# Patient Record
Sex: Female | Born: 2005 | Race: White | Hispanic: No | Marital: Single | Smoking: Never smoker
Health system: Southern US, Community
[De-identification: ages and names within clinical notes are randomized; demographics above are authoritative.]

## PROBLEM LIST (undated history)

## (undated) DIAGNOSIS — F419 Anxiety disorder, unspecified: Secondary | ICD-10-CM

## (undated) DIAGNOSIS — F22 Delusional disorders: Secondary | ICD-10-CM

## (undated) DIAGNOSIS — E282 Polycystic ovarian syndrome: Secondary | ICD-10-CM

## (undated) DIAGNOSIS — K029 Dental caries, unspecified: Secondary | ICD-10-CM

## (undated) DIAGNOSIS — Z8719 Personal history of other diseases of the digestive system: Secondary | ICD-10-CM

## (undated) DIAGNOSIS — F32A Depression, unspecified: Secondary | ICD-10-CM

## (undated) DIAGNOSIS — K047 Periapical abscess without sinus: Secondary | ICD-10-CM

## (undated) DIAGNOSIS — F329 Major depressive disorder, single episode, unspecified: Secondary | ICD-10-CM

## (undated) HISTORY — DX: Polycystic ovarian syndrome: E28.2

## (undated) HISTORY — PX: TOOTH EXTRACTION: SUR596

---
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
## 2023-01-03 DIAGNOSIS — F250 Schizoaffective disorder, bipolar type: Secondary | ICD-10-CM | POA: Diagnosis not present

## 2023-01-03 DIAGNOSIS — F840 Autistic disorder: Secondary | ICD-10-CM | POA: Diagnosis not present

## 2023-01-04 DIAGNOSIS — F250 Schizoaffective disorder, bipolar type: Secondary | ICD-10-CM | POA: Diagnosis not present

## 2023-01-05 ENCOUNTER — Other Ambulatory Visit (INDEPENDENT_AMBULATORY_CARE_PROVIDER_SITE_OTHER): Payer: Self-pay | Admitting: Family

## 2023-01-16 DIAGNOSIS — H60592 Other noninfective acute otitis externa, left ear: Secondary | ICD-10-CM | POA: Diagnosis not present

## 2023-01-16 DIAGNOSIS — H6122 Impacted cerumen, left ear: Secondary | ICD-10-CM | POA: Diagnosis not present

## 2023-01-16 DIAGNOSIS — R140 Abdominal distension (gaseous): Secondary | ICD-10-CM | POA: Diagnosis not present

## 2023-01-16 DIAGNOSIS — N9489 Other specified conditions associated with female genital organs and menstrual cycle: Secondary | ICD-10-CM | POA: Diagnosis not present

## 2023-03-06 DIAGNOSIS — F250 Schizoaffective disorder, bipolar type: Secondary | ICD-10-CM | POA: Diagnosis not present

## 2023-04-06 DIAGNOSIS — F250 Schizoaffective disorder, bipolar type: Secondary | ICD-10-CM | POA: Diagnosis not present

## 2023-04-09 DIAGNOSIS — F840 Autistic disorder: Secondary | ICD-10-CM | POA: Diagnosis not present

## 2023-04-09 DIAGNOSIS — F250 Schizoaffective disorder, bipolar type: Secondary | ICD-10-CM | POA: Diagnosis not present

## 2023-04-09 DIAGNOSIS — Z79899 Other long term (current) drug therapy: Secondary | ICD-10-CM | POA: Diagnosis not present

## 2023-05-08 DIAGNOSIS — F250 Schizoaffective disorder, bipolar type: Secondary | ICD-10-CM | POA: Diagnosis not present

## 2023-07-03 DIAGNOSIS — L680 Hirsutism: Secondary | ICD-10-CM | POA: Diagnosis not present

## 2023-07-03 DIAGNOSIS — N946 Dysmenorrhea, unspecified: Secondary | ICD-10-CM | POA: Diagnosis not present

## 2023-07-03 DIAGNOSIS — R635 Abnormal weight gain: Secondary | ICD-10-CM | POA: Diagnosis not present

## 2023-07-03 DIAGNOSIS — Z1330 Encounter for screening examination for mental health and behavioral disorders, unspecified: Secondary | ICD-10-CM | POA: Diagnosis not present

## 2023-07-03 DIAGNOSIS — N915 Oligomenorrhea, unspecified: Secondary | ICD-10-CM | POA: Diagnosis not present

## 2023-07-09 DIAGNOSIS — F250 Schizoaffective disorder, bipolar type: Secondary | ICD-10-CM | POA: Diagnosis not present

## 2023-07-09 DIAGNOSIS — N915 Oligomenorrhea, unspecified: Secondary | ICD-10-CM | POA: Diagnosis not present

## 2023-07-10 DIAGNOSIS — F840 Autistic disorder: Secondary | ICD-10-CM | POA: Diagnosis not present

## 2023-07-10 DIAGNOSIS — F250 Schizoaffective disorder, bipolar type: Secondary | ICD-10-CM | POA: Diagnosis not present

## 2023-07-12 DIAGNOSIS — N915 Oligomenorrhea, unspecified: Secondary | ICD-10-CM | POA: Diagnosis not present

## 2023-07-12 DIAGNOSIS — N946 Dysmenorrhea, unspecified: Secondary | ICD-10-CM | POA: Diagnosis not present

## 2023-07-12 DIAGNOSIS — R635 Abnormal weight gain: Secondary | ICD-10-CM | POA: Diagnosis not present

## 2023-07-12 DIAGNOSIS — L680 Hirsutism: Secondary | ICD-10-CM | POA: Diagnosis not present

## 2023-07-24 DIAGNOSIS — F250 Schizoaffective disorder, bipolar type: Secondary | ICD-10-CM | POA: Diagnosis not present

## 2023-07-24 DIAGNOSIS — F840 Autistic disorder: Secondary | ICD-10-CM | POA: Diagnosis not present

## 2023-08-03 ENCOUNTER — Other Ambulatory Visit (INDEPENDENT_AMBULATORY_CARE_PROVIDER_SITE_OTHER): Payer: Self-pay | Admitting: Family

## 2023-08-06 DIAGNOSIS — F250 Schizoaffective disorder, bipolar type: Secondary | ICD-10-CM | POA: Diagnosis not present

## 2023-09-17 ENCOUNTER — Ambulatory Visit (INDEPENDENT_AMBULATORY_CARE_PROVIDER_SITE_OTHER): Payer: Self-pay | Admitting: Family

## 2023-09-17 ENCOUNTER — Encounter (INDEPENDENT_AMBULATORY_CARE_PROVIDER_SITE_OTHER): Payer: Self-pay | Admitting: Family

## 2023-09-17 VITALS — BP 110/86 | HR 98 | Wt 182.3 lb

## 2023-09-17 DIAGNOSIS — E6609 Other obesity due to excess calories: Secondary | ICD-10-CM

## 2023-09-17 DIAGNOSIS — Z6832 Body mass index (BMI) 32.0-32.9, adult: Secondary | ICD-10-CM

## 2023-09-17 DIAGNOSIS — R7303 Prediabetes: Secondary | ICD-10-CM

## 2023-09-17 DIAGNOSIS — N926 Irregular menstruation, unspecified: Secondary | ICD-10-CM

## 2023-09-17 DIAGNOSIS — E782 Mixed hyperlipidemia: Secondary | ICD-10-CM | POA: Diagnosis not present

## 2023-09-17 DIAGNOSIS — E66811 Obesity, class 1: Secondary | ICD-10-CM

## 2023-09-17 DIAGNOSIS — E039 Hypothyroidism, unspecified: Secondary | ICD-10-CM

## 2023-09-17 MED ORDER — WEGOVY 0.25 MG/0.5ML ~~LOC~~ SOAJ: 0.2500 mg | SUBCUTANEOUS | 3 refills | Status: AC

## 2023-09-17 NOTE — Progress Notes (Signed)
 Pediatric Endocrinology Consultation Follow up Visit  Ana, Kent 08/03/2005  [facility]  Chief Complaint: hyperglycemia, obesity   History obtained from: patient, parent, and review of records from PCP  HPI: Ana Kent  is a 18 y.o. female being seen in consultation at the request of  [facility] for evaluation of the above concerns.  she is accompanied to this visit by her Mother   1. Ana Kent is being referred by her PCP after concern for hyperglycemia and weight gain. She reported having fasting blood glucose of 190 when blood sugar was checked at home. She has previously been on Metformin but stopped taking it on 10/2021. Ana Kent is also on multiple psychiatric medications which have led to significant weight gain of >60 lbs since 04/2022. Family is very concerned that the use of psychiatric medication is causing hyperglycemia and weight gain which may lead to type 2 diabetes. She had labs drawn by PCP on 02/03/2022 which showed normal hemoglobin A1c 5.4%, elevated TSH of 6.55, elevated cholesterol 272, LDL 167 and triglyceride of 340. Her C peptide of 3.38 showed she is producing endogenous insulin. CMP showed normal AST and ALT. Previous hemoglobin A1c on 10/2021 was 5.8%.   2. Ana Kent was last seen in clinic on 08/2022 since that time she has been well.   She is not taking Metformin due to causing "psychiatric issues" while using it. Reports more trouble with her moods and psychosis while on it. Would like to try [facility].   Abnormal Cycles  - Being followed by OBGYN for PCOS  - They have discussed OCP but Ana Kent is not interested in OCP at this time.  - Had labs done on 06/2023   Hypothyroidism  Taking 25 mcg of levothyroxine every morning.  - Denies fatigue, constipation and cold intolerance.   Hyperlipidemia  Takes 5 mg of Rosuvastatin daily. Denies muscle, joint pain and Gi side effect.   Obesity/prediabetes   Diet:  - "not that great".  - She eats  healthy foods when she is at home  - Limits going out eat to less then once per week.  - Rarely has sugar drinks.  - She usually has one serving at meals, eats normal size servings.  - Snacks: "all sorts of stuff".    Exercise:  - She has not been exercising but is "thinking about it".   ROS: All systems reviewed with pertinent positives listed below; otherwise negative. Constitutional: + 9 lbs weight loss. .  Sleeping well HEENT: No vision changes. No difficulty swallowing.  Respiratory: No increased work of breathing currently GI: No constipation or diarrhea GU: Irregular menstrual cycles.  Musculoskeletal: No joint deformity Neuro: Normal affect. No tremors.  Endocrine: As above   Past Medical History:  Past Medical History:  Diagnosis Date   Anxiety    Delusions (HCC)    Dental abscess 12/2014   current antibiotic, started 12/22/2014   Dental caries 12/2014   Depression    History of esophageal reflux    resolved, per mother   PCOS (polycystic ovarian syndrome)     Birth History: Pregnancy uncomplicated. Delivered at term Discharged home with mom  Meds: Outpatient Encounter Medications as of 09/17/2023  Medication Sig   cloZAPine (CLOZARIL) 100 MG tablet Take 150 mg by mouth at bedtime.   divalproex (DEPAKOTE) 500 MG DR tablet Take 1 tablet (500 mg total) by mouth 2 (two) times daily before lunch and supper. (Patient taking differently: Take 500 mg by mouth 1 day or 1  dose.)   Omega-3 Fatty Acids (FISH OIL) 1000 MG CAPS Take 1,000 mg by mouth at bedtime.   rosuvastatin (CRESTOR) 5 MG tablet TAKE 1 TABLET(5 MG) BY MOUTH DAILY   spironolactone (ALDACTONE) 100 MG tablet Take 100 mg by mouth at bedtime.   benztropine (COGENTIN) 1 MG tablet Take 1 tablet (1 mg total) by mouth 2 (two) times daily as needed for tremors. (Patient not taking: Reported on 09/17/2023)   Cholecalciferol (VITAMIN D3 PO) Take 2 tablets by mouth at bedtime. (Patient not taking: Reported on 08/16/2022)    Cyanocobalamin (VITAMIN B-12 PO) Take 1 tablet by mouth at bedtime. (Patient not taking: Reported on 09/17/2023)   diphenhydrAMINE (BENADRYL) 50 MG capsule Take 1 capsule daily at bed time. (Patient not taking: Reported on 09/17/2023)   hydrOXYzine (ATARAX/VISTARIL) 25 MG tablet Take 1 tablet (25 mg total) by mouth 3 (three) times daily as needed for anxiety. (Patient not taking: Reported on 09/17/2023)   levothyroxine (SYNTHROID) 25 MCG tablet TAKE 1 TABLET(25 MCG) BY MOUTH DAILY BEFORE BREAKFAST (Patient not taking: Reported on 09/17/2023)   metFORMIN (GLUCOPHAGE) 500 MG tablet Take 1 tablet (500 mg total) by mouth 2 (two) times daily with a meal. Start 500 mg (1 tablet) at dinner x 2 weeks. Then increase to 1 tablet twice daily with food. (Patient not taking: Reported on 09/17/2023)   mirtazapine (REMERON) 30 MG tablet Take 1 tablet (30 mg total) by mouth at bedtime. (Patient not taking: Reported on 09/17/2023)   OLANZapine (ZYPREXA) 10 MG tablet Take 1 tablet (10 mg total) by mouth at bedtime. (Patient not taking: Reported on 09/17/2023)   paliperidone (INVEGA SUSTENNA) 156 MG/ML SUSY injection Inject 1 mL (156 mg total) into the muscle every 28 (twenty-eight) days. (Patient not taking: Reported on 09/17/2023)   traZODone (DESYREL) 50 MG tablet Take 1 tablet (50 mg total) by mouth at bedtime as needed for sleep. (Patient not taking: Reported on 09/17/2023)   No facility-administered encounter medications on file as of 09/17/2023.    Allergies: No Known Allergies  Surgical History: Past Surgical History:  Procedure Laterality Date   TOOTH EXTRACTION     TOOTH EXTRACTION N/A 01/07/2015   Procedure: DENTAL DEXTRACTIONS;  Surgeon: Vivianne Spence, DDS;  Location: [facility];  Service: Dentistry;  Laterality: N/A;    Family History:  Family History  Problem Relation Age of Onset   Anesthesia problems Mother        post-op nausea   Asthma Father    Diabetes type I Brother      Social  History:  Social History   Social History Narrative   Graduated 2024   Lives with mom, dad, brother, and sister     Physical Exam:  Vitals:   09/17/23 1119  BP: 110/86  Pulse: 98  Weight: 182 lb 4.8 oz (82.7 kg)     Body mass index: body mass index is 32.42 kg/m. Blood pressure %iles are not available for patients who are 18 years or older.  Wt Readings from Last 3 Encounters:  09/17/23 182 lb 4.8 oz (82.7 kg) (95%, Z= 1.66)*  08/16/22 191 lb 12.8 oz (87 kg) (97%, Z= 1.86)*  05/16/22 185 lb 3.2 oz (84 kg) (96%, Z= 1.77)*   * Growth percentiles are based on CDC (Girls, 2-20 Years) data.   Ht Readings from Last 3 Encounters:  08/16/22 5' 2.87" (1.597 m) (30%, Z= -0.53)*  05/16/22 5' 2.95" (1.599 m) (31%, Z= -0.50)*  02/15/22 5' 2.6" (1.59  m) (26%, Z= -0.63)*   * Growth percentiles are based on CDC (Girls, 2-20 Years) data.     95 %ile (Z= 1.66) based on CDC (Girls, 2-20 Years) weight-for-age data using data from 09/17/2023. No height on file for this encounter. 96 %ile (Z= 1.71) based on CDC (Girls, 2-20 Years) BMI-for-age data using weight from 09/17/2023 and height from 08/16/2022.  General: Well developed, well nourished female in no acute distress.   Head: Normocephalic, atraumatic.   Eyes:  Pupils equal and round. EOMI.   Sclera white.  No eye drainage.   Ears/Nose/Mouth/Throat: Nares patent, no nasal drainage.  Normal dentition, mucous membranes moist.   Neck: supple, no cervical lymphadenopathy, no thyromegaly Cardiovascular: regular rate, normal S1/S2, no murmurs Respiratory: No increased work of breathing.  Lungs clear to auscultation bilaterally.  No wheezes. Abdomen: soft, nontender, nondistended. No appreciable masses  Extremities: warm, well perfused, cap refill < 2 sec.   Musculoskeletal: Normal muscle mass.  Normal strength Skin: warm, dry.  No rash or lesions. + acanthosis nigricans  Neurologic: alert and oriented, normal speech, no  tremor    Laboratory Evaluation: 07/09/2023 - TSH: 3.03  - FT4: 1.41 - AST 31 - ALT 27  - Testosterone 53 - HBA1c: 5.6  - LH: 16.7 - FSH 5.1 - DHEA-s: 189  - Insulin: 28     Assessment/Plan: Ana Kent is a 18 y.o. female with hypothyroidism, hyperlipidemia and obesity. Hemoglobin A1c improved to 5.6% since last visit. She has lost 9 lbs, Body mass index is 32.42 kg/m. She is clinically euthyroid, will check labs today. Tolerating Rosuvastatin well.   I have placed referral to adult endocrinology for Meloni today and discussed extensively with family.    1. Prediabetes  2. Obesity Class 1  BMI 32-32.9  -Eliminate sugary drinks (regular soda, juice, sweet tea, regular gatorade) from your diet -Drink water or milk (preferably 1% or skim) -Avoid fried foods and junk food (chips, cookies, candy) -Watch portion sizes -Pack your lunch for school -Try to get 30 minutes of activity daily - Unable to tolerate Metformin. Will try [facility] which is beneficial for both obesity and reducing insulin resistance. Advised this is not to be taken with pregnancy. Discussed potential side effects.   2. Mixed hyperlipidemia - 5 mg of Rosuvastatin daily  - Lab Orders         TSH         T4, free         Lipid panel      3. Acquired hypothyroidism  -Discussed s/s of hypothyroidism  - 25 mcg mcg of levothyroxine  Lab Orders         TSH         T4, free         Lipid panel     4. Abnormal Menstrual cycles  - Managed by GYN, discussing option for OCP.   Follow-up:  4 months.   Medical decision-making:  >56 minutes  spent today reviewing the medical chart, counseling the patient/family, and documenting today's visit.     Gretchen Short, DNP, FNP-C  Pediatric Specialist  [address]  [place], [address]  Tele: [phone]

## 2023-09-17 NOTE — Patient Instructions (Signed)
 It was a pleasure seeing you in clinic today. Please do not hesitate to contact me if you have questions or concerns.   Please sign up for MyChart. This is a communication tool that allows you to send an email directly to me. This can be used for questions, prescriptions and blood sugar reports. We will also release labs to you with instructions on MyChart. Please do not use MyChart if you need immediate or emergency assistance. Ask our wonderful front office staff if you need assistance.   - Labs ordered today   - Referral placed to [facility]   - Continue levothyroxine and rosuvastatin   -Eliminate sugary drinks (regular soda, juice, sweet tea, regular gatorade) from your diet -Drink water or milk (preferably 1% or skim) -Avoid fried foods and junk food (chips, cookies, candy) -Watch portion sizes -Pack your lunch for school -Try to get 30 minutes of activity daily   -Take your medication at the same time every day -Try to take it on an empty stomach -If you forget to take a dose, take it as soon as you remember.  If you don't remember until the next day, take 2 doses then.  NEVER take more than 2 doses at a time. -Use a pill box to help make it easier to keep track of doses

## 2023-09-18 ENCOUNTER — Telehealth (INDEPENDENT_AMBULATORY_CARE_PROVIDER_SITE_OTHER): Payer: Self-pay

## 2023-09-18 ENCOUNTER — Other Ambulatory Visit (INDEPENDENT_AMBULATORY_CARE_PROVIDER_SITE_OTHER): Payer: Self-pay | Admitting: Family

## 2023-09-18 LAB — LIPID PANEL
Cholesterol: 228 mg/dL — ABNORMAL HIGH (ref <170)
HDL: 43 mg/dL — ABNORMAL LOW (ref >45)
LDL Cholesterol (Calc): 140 mg/dL — ABNORMAL HIGH (ref <110)
Non-HDL Cholesterol (Calc): 185 mg/dL — ABNORMAL HIGH (ref <120)
Total CHOL/HDL Ratio: 5.3 (calc) — ABNORMAL HIGH (ref <5.0)
Triglycerides: 312 mg/dL — ABNORMAL HIGH (ref <90)

## 2023-09-18 LAB — TSH: TSH: 1.32 m[IU]/L

## 2023-09-18 LAB — T4, FREE: Free T4: 1.1 ng/dL (ref 0.8–1.4)

## 2023-09-18 MED ORDER — LEVOTHYROXINE SODIUM 25 MCG PO TABS
25.0000 ug | ORAL_TABLET | Freq: Every day | ORAL | 4 refills | Status: AC
Start: 2023-09-18 — End: ?

## 2023-09-18 MED ORDER — ROSUVASTATIN CALCIUM 10 MG PO TABS: 10.0000 mg | ORAL_TABLET | Freq: Every day | ORAL | 4 refills | Status: AC

## 2023-09-18 NOTE — Telephone Encounter (Signed)
 Called mom and relayed result notes. Mom verbalized good understanding, mom asked about an update about the weight loss medication. I did let mom know once we receive the prior authorization I will send it to the PA team and when we receive an approval or denial I will let Spenser know and call her as well. Mom verbalized understanding and had no further questions.

## 2023-09-18 NOTE — Telephone Encounter (Signed)
-----   Message from Gretchen Short sent at 09/18/2023  7:38 AM EDT ----- Please call family. Thyroid levels look great, continue current dose of levothyroxine. Her lipid panel shows that her triglycerides and LDL are higher, Increase rosuvastatin to 10 mg once daily. She will need to have these levels rechecked but PCP or adult endocrinology in the next 4 months.

## 2023-09-20 ENCOUNTER — Telehealth (INDEPENDENT_AMBULATORY_CARE_PROVIDER_SITE_OTHER): Payer: Self-pay | Admitting: Pharmacy Technician

## 2023-09-20 ENCOUNTER — Other Ambulatory Visit (HOSPITAL_COMMUNITY): Payer: Self-pay

## 2023-09-20 NOTE — Telephone Encounter (Signed)
 Pharmacy Patient Advocate Encounter  Received notification from [facility] that Prior Authorization for [facility] 0.25MG /0.5ML auto-injectors has been DENIED.  Full denial letter will be uploaded to the media tab. See denial reason below.   E-Appeal available. Please advise.   PA #/Case ID/Reference #: [id number]

## 2023-09-20 NOTE — Telephone Encounter (Signed)
 Pharmacy Patient Advocate Encounter   Received notification from CoverMyMeds that prior authorization for [facility] 0.25MG /0.5ML auto-injectors is required/requested.   Insurance verification completed.   The patient is insured through [facility] .   Per test claim: PA required; PA submitted to above mentioned insurance via CoverMyMeds Key/confirmation #/EOC [id number] Status is pending

## 2023-09-21 NOTE — Telephone Encounter (Signed)
 Attempted to call family, no answer left HIPPA approved message to return call

## 2023-09-24 NOTE — Telephone Encounter (Signed)
 Called mom and relayed that she was denied. Mom would like to know if their is anything else that they can do? Mom asked why it was denied and if they need more information from other doctors, I Let mom know her insurance does not cover the medication. Mom asked if it shows what they do cover and I let her know unfortunately it does not and she can call her insurance company to find out which medication they do cover.

## 2023-09-26 NOTE — Telephone Encounter (Signed)
 Called mom and see if she had an update, mom stated she will call her insurance and let us  know

## 2023-11-20 DIAGNOSIS — F250 Schizoaffective disorder, bipolar type: Secondary | ICD-10-CM | POA: Diagnosis not present

## 2023-11-20 DIAGNOSIS — F840 Autistic disorder: Secondary | ICD-10-CM | POA: Diagnosis not present

## 2023-11-20 NOTE — Progress Notes (Signed)
 ------------------------------------------------------------------------------- Attestation signed by Ileana Rebecca Coventry, MD at 11/30/2023  4:08 PM I have discussed the case of Ana Kent with the resident, and I agree with the assessment and plan.   -------------------------------------------------------------------------------   Psychiatry Outpatient Return   Date of Service: 11/20/2023 Chief Complaint / Purpose: Medication Management Follow-up History From: patient and medical record  Subjective/Interval History   Ana Kent is a 18 y.o. female with a past psychiatric history of ASD and schizoaffective (bipolar), who presents today for a psychiatric follow up appointment. The patient was last seen on 07/24/2023, at which time the following plan was agreed upon:  Medications: - Continue Clozaril  150 mg nightly for schizoaffective disorder - Continue Depakote  ER 750 mg nightly for schizoaffective disorder - Continue Hydroxyzine  10 mg once prn   _______________________________________________________________  History of Present Illness The patient presents for evaluation of mental health.  She has recently completed her education and is actively seeking employment to gain practical experience. Her daily activities include spending time with her pets, engaging in artistic pursuits such as drawing, and watching television. Over the past few months, she reports a stable mental and emotional state, with no symptoms of depression or anxiety. She continues to derive pleasure from her usual activities and has not experienced any disturbances in sleep or appetite. There are no reported hallucinations, delusions, or paranoia. She feels that her condition is stable and improving. She reports no adverse effects from her current medication regimen. The accompanying adult female notes that the dosage of Depakote  has been reduced to 500 mg daily. Her current medications include clozapine  150 mg and  Depakote  500 mg extended-release tablet. It is unclear whether she is currently taking hydroxyzine .  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 Anxiety Symptoms: Patient reports no symptoms of anxiety. Psychosis Symptoms: Patient reports no symptoms of psychosis. Mania Symptoms: Patient reports no symptoms of mania. Trauma-Related Symptoms: Patient reports no symptoms of PTSD.  ROS: The remainder of a full review of systems was conducted and was not notable except as previously documented in HPI.  Medications and Allergies   Current Outpatient Medications on File Prior to Visit  Medication Sig Dispense Refill  . cholecalciferol , vitamin D3, (cholecalciferol , vit D3,,bulk,) 100,000 unit/gram powd Take 2 tablets by mouth nightly.    . cloZAPine  (CLOZARIL ) 100 mg tablet TAKE 1 AND 1/2 TABLET BY MOUTH NIGHTLY. 45 tablet 2  . divalproex  (DEPAKOTE ) 250 mg extended release tablet Take 1 tablet (250 mg total) by mouth daily. In addition to Depakote  ER 500 mg nightly. Total Daily Dose of Depakote  ER is 750 mg nightly. 90 tablet 1  . divalproex  (DEPAKOTE ) 500 mg extended release tablet Take 1 tablet (500 mg total) by mouth daily. In addition to Depakote  ER 250 mg nightly. Total Daily Dose of Depakote  ER is 750 mg nightly. 90 tablet 1  . docosahexaenoic acid-epa (Fish OiL) 120-180 mg cap Take  1,000 mg  by mouth.    . rosuvastatin  (CRESTOR ) 5 mg tablet Take 5 mg by mouth daily.     No current facility-administered medications on file prior to visit.   Allergies  Allergen Reactions  . Metformin  Mental Status Changes    Destabilization of psychiatric illness     Brief Psychiatric History   Suicide Attempt/Self-Harm History: SA x1 in 2018 via hanging; Self harm via cutting in the past   Psychotherapy: None. Was in [facility]   Previous psychiatric medication trials: Geodon , Zyprexa , Invega  Sustenna, clonidine , Wellbutrin , trazodone , Cogentin , Risperdal, Abilify   Pertinent Social History   School: 12th grade at [employer]. Wants to be in cosmetology school Living Situation: Lives with father, mother, and sister in [place], [place]   Objective   Vitals:  There were no vitals filed for this visit. - Virtual Visit  Labs: Metabolic monitoring (Lipid Profile, HgbA1C) There is no height or weight on file to calculate BMI.  Lab Results  Component Value Date   CHOL 179 08/06/2023   HDL 37 (L) 08/06/2023   TRIG 240 (H) 08/06/2023   Urine drug screen No results found for: AMPH, RESA BATCH, THC Medication trough levels Lab Results  Component Value Date   VALPROATE 92 10/27/2022   CBC/CMP Lab Results  Component Value Date   WBC 10.50 08/06/2023   RBC 4.49 08/06/2023   HGB 13.2 08/06/2023   HCT 39.1 08/06/2023   MCV 87.1 08/06/2023   NA 136 01/04/2023   BUN 17 01/04/2023   CREATININE 0.71 01/04/2023   AST 25 (H) 01/04/2023   ALT 22 01/04/2023   NEUTROABS 7.10 08/06/2023   PLT 313 08/06/2023   TSH 2.877 01/04/2023   08/16/22: Cholesterol 211 LDL 131 HDL 46 TSH 1.24  Mental Status Examination: General Appearance obese, appears stated age and hygiene appropriate  General Behavior cooperative, pleasant and appropriate eye contact  Psychomotor Activity normoactive  Gait and Station no gait abnormalities  Speech   normal rate, fluent, normal  volume, normal tone, normal prosody and normal amount  Mood   A lot better  Affect    Euthymic, inappropriate smiling/laughing at times  Thought Process linear/organized and goal directed  Associations Intact  Thought Content/Perceptual Disturbances Denies suicidal/homicidal ideation, auditory/visual hallucinations, or paranoia. Did not appear to be RTIS.  Cognition/Sensorium  orientation intact (AAOx4), memory intact, attention intact and language normal  Insight  Fair  Judgment Fair     Assessment   Psychiatric Diagnoses: Autism Spectrum Disorder (F84) Schizoaffective Disorder, Bipolar Type (F25.0)  Past Medical History:  Diagnosis Date  . Depression   . Generalized abdominal pain 09/22/2016  . Suicidal ideation 02/27/2017    Patient with history detailed above.   The patient reports stable mental health with no symptoms of depression, anxiety, sleep disturbances, or appetite changes. There are no signs of hallucinations, delusions, or paranoia. The patient is actively seeking employment and engaging in various daily activities, indicating a positive outlook and stable emotional health. Current medications, clozapine  150 mg and Depakote  500 mg extended-release tablet, are well-tolerated without any reported side effects.  Therapeutic Intervention: - Supportive discussion regarding job search and daily activities. - Reaffirmation of stable mental health and emotional well-being. - Medication review and confirmation of no side effects.  There are no acute safety concerns at this time.  Continue outpatient level of care.  Plan   Medications: - Continue Clozaril  150 mg nightly for schizoaffective disorder - Continue Depakote  ER 500 mg nightly  for schizoaffective disorder - Continue Hydroxyzine  10 mg TID prn (not using)  Labs: - Reviewed 11/20/2023, see above - 1/27: WBC 11.1, ANC 5.6 - Patient underwent labwork on 07/09/23 (patient's mother showed this provider the physical  lab results), A1c of 5.6 - Obtain clozapine  level, lipid panel  Other:  - Not currently counseling - encouraged to establish care and provided resources. - Go to ED with emergent symptoms or safety concerns. - Patient has participated in the development of this treatment plan and verbalized agreement with plan as listed.   Follow Up: Return in 2 months  - Call in the interim for any side-effects, decompensation, questions, or problems between now and the next visit.   Case discussed with Dr. Epifanio Penne Door, MD 11/20/2023  [facility] Department of Psychiatry & Behavioral Medicine

## 2023-11-27 NOTE — Telephone Encounter (Signed)
 Left Message - Called to schedule: FT appt, virtual, Dr. Lorretta, 2 month f/u

## 2024-01-31 DIAGNOSIS — L02214 Cutaneous abscess of groin: Secondary | ICD-10-CM | POA: Diagnosis not present

## 2024-03-13 DIAGNOSIS — Z79899 Other long term (current) drug therapy: Secondary | ICD-10-CM | POA: Diagnosis not present

## 2024-03-13 DIAGNOSIS — F250 Schizoaffective disorder, bipolar type: Secondary | ICD-10-CM | POA: Diagnosis not present

## 2024-03-13 DIAGNOSIS — F840 Autistic disorder: Secondary | ICD-10-CM | POA: Diagnosis not present

## 2024-04-16 DIAGNOSIS — Z79899 Other long term (current) drug therapy: Secondary | ICD-10-CM | POA: Diagnosis not present
